# Patient Record
Sex: Female | Born: 1960 | Race: White | Hispanic: No | Marital: Married | Smoking: Current every day smoker
Health system: Southern US, Community
[De-identification: ages and names within clinical notes are randomized; demographics above are authoritative.]

## PROBLEM LIST (undated history)

## (undated) HISTORY — PX: CHOLECYSTECTOMY: SHX55

---

## 2000-01-09 ENCOUNTER — Encounter: Payer: Self-pay | Admitting: Gynecology

## 2000-01-09 ENCOUNTER — Encounter: Admission: RE | Admit: 2000-01-09 | Discharge: 2000-01-09 | Payer: Self-pay | Admitting: Gynecology

## 2001-09-24 ENCOUNTER — Other Ambulatory Visit: Admission: RE | Admit: 2001-09-24 | Discharge: 2001-09-24 | Payer: Self-pay | Admitting: Gynecology

## 2003-04-08 ENCOUNTER — Other Ambulatory Visit: Admission: RE | Admit: 2003-04-08 | Discharge: 2003-04-08 | Payer: Self-pay | Admitting: Gynecology

## 2003-08-26 ENCOUNTER — Encounter: Admission: RE | Admit: 2003-08-26 | Discharge: 2003-08-26 | Payer: Self-pay | Admitting: Gynecology

## 2004-08-23 ENCOUNTER — Other Ambulatory Visit: Admission: RE | Admit: 2004-08-23 | Discharge: 2004-08-23 | Payer: Self-pay | Admitting: Gynecology

## 2005-01-03 ENCOUNTER — Ambulatory Visit: Payer: Self-pay | Admitting: Hematology and Oncology

## 2005-06-27 ENCOUNTER — Ambulatory Visit: Payer: Self-pay | Admitting: Oncology

## 2005-09-06 ENCOUNTER — Other Ambulatory Visit: Admission: RE | Admit: 2005-09-06 | Discharge: 2005-09-06 | Payer: Self-pay | Admitting: Gynecology

## 2005-09-26 ENCOUNTER — Encounter: Admission: RE | Admit: 2005-09-26 | Discharge: 2005-09-26 | Payer: Self-pay | Admitting: Gynecology

## 2006-10-24 ENCOUNTER — Other Ambulatory Visit: Admission: RE | Admit: 2006-10-24 | Discharge: 2006-10-24 | Payer: Self-pay | Admitting: Gynecology

## 2006-11-12 ENCOUNTER — Encounter: Admission: RE | Admit: 2006-11-12 | Discharge: 2006-11-12 | Payer: Self-pay | Admitting: Gynecology

## 2006-11-21 ENCOUNTER — Encounter: Admission: RE | Admit: 2006-11-21 | Discharge: 2006-11-21 | Payer: Self-pay | Admitting: Gynecology

## 2007-11-19 ENCOUNTER — Encounter: Admission: RE | Admit: 2007-11-19 | Discharge: 2007-11-19 | Payer: Self-pay | Admitting: Gynecology

## 2021-10-16 ENCOUNTER — Encounter (HOSPITAL_BASED_OUTPATIENT_CLINIC_OR_DEPARTMENT_OTHER): Payer: Self-pay

## 2021-10-16 ENCOUNTER — Emergency Department (HOSPITAL_BASED_OUTPATIENT_CLINIC_OR_DEPARTMENT_OTHER)
Admission: EM | Admit: 2021-10-16 | Discharge: 2021-10-16 | Disposition: A | Discharge status: Home / Self Care | Payer: BC Managed Care – PPO | Attending: Emergency Medicine | Admitting: Emergency Medicine

## 2021-10-16 ENCOUNTER — Emergency Department (HOSPITAL_BASED_OUTPATIENT_CLINIC_OR_DEPARTMENT_OTHER): Payer: BC Managed Care – PPO

## 2021-10-16 ENCOUNTER — Other Ambulatory Visit: Payer: Self-pay

## 2021-10-16 DIAGNOSIS — R197 Diarrhea, unspecified: Secondary | ICD-10-CM | POA: Diagnosis not present

## 2021-10-16 DIAGNOSIS — R1013 Epigastric pain: Secondary | ICD-10-CM | POA: Diagnosis not present

## 2021-10-16 DIAGNOSIS — R110 Nausea: Secondary | ICD-10-CM | POA: Diagnosis not present

## 2021-10-16 LAB — CBC WITH DIFFERENTIAL/PLATELET
Abs Immature Granulocytes: 0.02 10*3/uL (ref 0.00–0.07)
Basophils Absolute: 0.1 10*3/uL (ref 0.0–0.1)
Basophils Relative: 1 %
Eosinophils Absolute: 0.1 10*3/uL (ref 0.0–0.5)
Eosinophils Relative: 2 %
HCT: 44.3 % (ref 36.0–46.0)
Hemoglobin: 14.7 g/dL (ref 12.0–15.0)
Immature Granulocytes: 0 %
Lymphocytes Relative: 35 %
Lymphs Abs: 2.2 10*3/uL (ref 0.7–4.0)
MCH: 31.1 pg (ref 26.0–34.0)
MCHC: 33.2 g/dL (ref 30.0–36.0)
MCV: 93.7 fL (ref 80.0–100.0)
Monocytes Absolute: 0.5 10*3/uL (ref 0.1–1.0)
Monocytes Relative: 8 %
Neutro Abs: 3.5 10*3/uL (ref 1.7–7.7)
Neutrophils Relative %: 54 %
Platelets: 211 10*3/uL (ref 150–400)
RBC: 4.73 MIL/uL (ref 3.87–5.11)
RDW: 12.6 % (ref 11.5–15.5)
WBC Morphology: ABNORMAL
WBC: 6.2 10*3/uL (ref 4.0–10.5)
nRBC: 0 % (ref 0.0–0.2)

## 2021-10-16 LAB — COMPREHENSIVE METABOLIC PANEL
ALT: 24 U/L (ref 0–44)
AST: 30 U/L (ref 15–41)
Albumin: 4.2 g/dL (ref 3.5–5.0)
Alkaline Phosphatase: 62 U/L (ref 38–126)
Anion gap: 10 (ref 5–15)
BUN: 9 mg/dL (ref 6–20)
CO2: 26 mmol/L (ref 22–32)
Calcium: 8.7 mg/dL — ABNORMAL LOW (ref 8.9–10.3)
Chloride: 99 mmol/L (ref 98–111)
Creatinine, Ser: 0.99 mg/dL (ref 0.44–1.00)
GFR, Estimated: >60 mL/min (ref >60)
Glucose, Bld: 115 mg/dL — ABNORMAL HIGH (ref 70–99)
Potassium: 3.9 mmol/L (ref 3.5–5.1)
Sodium: 135 mmol/L (ref 135–145)
Total Bilirubin: 0.7 mg/dL (ref 0.3–1.2)
Total Protein: 7.2 g/dL (ref 6.5–8.1)

## 2021-10-16 LAB — URINALYSIS, ROUTINE W REFLEX MICROSCOPIC
Bilirubin Urine: NEGATIVE
Glucose, UA: NEGATIVE mg/dL
Ketones, ur: NEGATIVE mg/dL
Leukocytes,Ua: NEGATIVE
Nitrite: NEGATIVE
Protein, ur: NEGATIVE mg/dL
Specific Gravity, Urine: 1.01 (ref 1.005–1.030)
pH: 7 (ref 5.0–8.0)

## 2021-10-16 LAB — URINALYSIS, MICROSCOPIC (REFLEX)

## 2021-10-16 LAB — LIPASE, BLOOD: Lipase: 28 U/L (ref 11–51)

## 2021-10-16 MED ORDER — SODIUM CHLORIDE 0.9 % IV BOLUS
1000.0000 mL | Freq: Once | INTRAVENOUS | Status: AC
  Administered 2021-10-16: 1000 mL via INTRAVENOUS

## 2021-10-16 MED ORDER — DICYCLOMINE HCL 20 MG PO TABS: 20.0000 mg | ORAL_TABLET | Freq: Two times a day (BID) | ORAL | 0 refills | Status: AC

## 2021-10-16 MED ORDER — IOHEXOL 300 MG/ML  SOLN
100.0000 mL | Freq: Once | INTRAMUSCULAR | Status: AC | PRN
  Administered 2021-10-16: 100 mL via INTRAVENOUS

## 2021-10-16 MED ORDER — ONDANSETRON 4 MG PO TBDP
4.0000 mg | ORAL_TABLET | Freq: Once | ORAL | Status: AC
  Administered 2021-10-16: 4 mg via ORAL
  Filled 2021-10-16: qty 1

## 2021-10-16 MED ORDER — ONDANSETRON HCL 4 MG PO TABS: 4.0000 mg | ORAL_TABLET | Freq: Four times a day (QID) | ORAL | 0 refills | Status: AC

## 2021-10-16 NOTE — ED Provider Notes (Addendum)
[facility] EMERGENCY DEPARTMENT Provider Note   CSN: [id number] Arrival date & time: 10/16/21  [id number]     History Chief Complaint  Patient presents with   Abdominal Pain    Carrie Hunt is a 61 y.o. female presents the emergency department for evaluation of diarrhea and abdominal pain.  Patient reports the first week into February, she was around her grandkids and got the same GI bug that they had and experienced 3 days of diarrhea, nausea, and vomiting without any abdominal pain.  She reports she was fine for a week and around Valentine's Day she started having some nausea.  A few days later, she reported some having some normal colored, watery stool around 6-7 episodes per day.  She reports that since then it is been 6-7 episodes per day.  Denies any dark or tarriness to it.  Denies any blood or bright red blood per rectum.  Denies any rectal pain.  She reports she started having some epigastric pain starting around February 16 to the 17th and feels like "her stomach is churning".  She was seen at her PCP office about this episode today and a few weeks ago.  Her stool tested positive for norovirus.  She was put on Carafate and continue to have the epigastric pain and diarrhea despite this.  Her PCP sent her to the emergency department since her insurance denied an outpatient CT scan.  She denies any vomiting other than the beginning of the month, fevers, chest pain, shortness of breath.  The patient does drink well water.  She reports she is a daily smoker and occasional marijuana user.  Denies any EtOH.   Abdominal Pain Associated symptoms: diarrhea and nausea   Associated symptoms: no chest pain, no chills, no cough, no dysuria, no fever, no hematuria, no shortness of breath, no sore throat and no vomiting       Home Medications Prior to Admission medications   Medication Sig Start Date End Date Taking? Authorizing Provider  dicyclomine (BENTYL) 20 MG tablet Take 1 tablet  (20 mg total) by mouth 2 (two) times daily. 10/16/21  Yes Sherrell Puller, PA-C  escitalopram (LEXAPRO) 10 MG tablet Take 10 mg by mouth daily.   Yes [provider]  ondansetron (ZOFRAN) 4 MG tablet Take 1 tablet (4 mg total) by mouth every 6 (six) hours. 10/16/21  Yes Sherrell Puller, PA-C      Allergies    Patient has no known allergies.    Review of Systems   Review of Systems  Constitutional:  Negative for chills and fever.  HENT:  Negative for congestion and sore throat.   Respiratory:  Negative for cough and shortness of breath.   Cardiovascular:  Negative for chest pain.  Gastrointestinal:  Positive for abdominal pain, diarrhea and nausea. Negative for anal bleeding, blood in stool, rectal pain and vomiting.  Genitourinary:  Negative for dysuria and hematuria.  Neurological:  Negative for weakness.   Physical Exam Updated Vital Signs BP 134/85    Pulse 73    Temp 98 F (36.7 C) (Oral)    Resp 17    Ht 5\' 4"  (1.626 m)    Wt 66.7 kg    SpO2 96%    BMI 25.23 kg/m  Physical Exam Vitals and nursing note reviewed.  Constitutional:      General: She is not in acute distress.    Appearance: She is not ill-appearing or toxic-appearing.  HENT:     Head: Normocephalic  and atraumatic.     Mouth/Throat:     Mouth: Mucous membranes are moist.     Pharynx: No pharyngeal swelling or oropharyngeal exudate.  Cardiovascular:     Rate and Rhythm: Normal rate.     Heart sounds: No murmur heard. Pulmonary:     Effort: No respiratory distress.     Breath sounds: Normal breath sounds.  Abdominal:     General: Abdomen is flat. Bowel sounds are normal.     Palpations: Abdomen is soft.     Tenderness: There is abdominal tenderness in the epigastric area. There is no right CVA tenderness, left CVA tenderness, guarding or rebound.     Comments: Small faint surgical scar noted near the umbilicus.  No other overlying skin changes noted.  No erythema, abrasion, ecchymosis, or rash noted.   Normal active bowel sounds.  Some mild tenderness palpation of the epigastric region without any guarding or rebound.  Soft abdomen.  Negative peritoneal signs.  Skin:    General: Skin is warm and dry.  Neurological:     Mental Status: She is alert.    ED Results / Procedures / Treatments   Labs (all labs ordered are listed, but only abnormal results are displayed) Labs Reviewed  URINALYSIS, ROUTINE W REFLEX MICROSCOPIC - Abnormal; Notable for the following components:      Result Value   Hgb urine dipstick TRACE (*)    All other components within normal limits  COMPREHENSIVE METABOLIC PANEL - Abnormal; Notable for the following components:   Glucose, Bld 115 (*)    Calcium 8.7 (*)    All other components within normal limits  URINALYSIS, MICROSCOPIC (REFLEX) - Abnormal; Notable for the following components:   Bacteria, UA RARE (*)    All other components within normal limits  CBC WITH DIFFERENTIAL/PLATELET  LIPASE, BLOOD    EKG None  Radiology CT ABDOMEN PELVIS W CONTRAST  Result Date: 10/16/2021 CLINICAL DATA:  Right upper quadrant abdominal pain on and off for 1 month. EXAM: CT ABDOMEN AND PELVIS WITH CONTRAST TECHNIQUE: Multidetector CT imaging of the abdomen and pelvis was performed using the standard protocol following bolus administration of intravenous contrast. RADIATION DOSE REDUCTION: This exam was performed according to the departmental dose-optimization program which includes automated exposure control, adjustment of the mA and/or kV according to patient size and/or use of iterative reconstruction technique. CONTRAST:  184mL OMNIPAQUE IOHEXOL 300 MG/ML  SOLN COMPARISON:  None. FINDINGS: Lower chest: The lung bases are clear of acute process. No pleural effusion or pulmonary lesions. The heart is normal in size. No pericardial effusion. Small hiatal hernia. Hepatobiliary: No hepatic lesions or intrahepatic biliary dilatation. The gallbladder is surgically absent. No common  bile duct dilatation. Pancreas: No mass, inflammation or ductal dilatation. Spleen: Normal size.  No focal lesions. Adrenals/Urinary Tract: Adrenal glands are normal. No renal lesions or hydronephrosis. No renal or obstructing ureteral calculi. The bladder is unremarkable. Stomach/Bowel: The stomach, duodenum, small bowel and colon are grossly normal without oral contrast. No inflammatory changes, mass lesions or obstructive findings. The appendix is normal. Vascular/Lymphatic: The aorta is normal in caliber. No dissection. The branch vessels are patent. The major venous structures are patent. No mesenteric or retroperitoneal mass or adenopathy. Small scattered lymph nodes are noted. Reproductive: The uterus and ovaries are unremarkable. Other: No pelvic mass or adenopathy. No free pelvic fluid collections. No inguinal mass or adenopathy. No abdominal wall hernia or subcutaneous lesions. Musculoskeletal: No significant bony findings. 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 IMPRESSION: 1. No acute abdominal/pelvic findings, mass lesions or adenopathy. 2. Status post cholecystectomy. No biliary dilatation. 3. Small hiatal hernia. Electronically Signed   By: Marijo Sanes M.D.   On: 10/16/2021 11:24    Procedures Procedures   Medications Ordered in ED Medications  sodium chloride 0.9 % bolus 1,000 mL (0 mLs Intravenous Stopped 10/16/21 1134)  iohexol (OMNIPAQUE) 300 MG/ML solution 100 mL (100 mLs Intravenous Contrast Given 10/16/21 1100)  ondansetron (ZOFRAN-ODT) disintegrating tablet 4 mg (4 mg Oral Given 10/16/21 1236)    ED Course/ Medical Decision Making/ A&P                           Medical Decision Making Amount and/or Complexity of Data Reviewed Labs: ordered. Radiology: ordered.  Risk Prescription drug management.   61 year old female presents emerged department for evaluation of epigastric pain and diarrhea ongoing to the past 3 to 4 weeks recent stool study positive for  Norovirus.  Differential diagnosis includes was not limited to infectious diarrhea, IBS, GERD, H. pylori, SBO, C. difficile.  Vital signs unremarkable.  Patient afebrile, normotensive, normal pulse rate, satting well on room air.  Physical exam is pertinent for well-appearing patient with some epigastric tenderness without any guarding or rebound.  Abdomen soft and nondistended with normal active bowel sounds.  No overlying skin changes noted.  Will order basic labs and abdominal CT.  Patient was given 1 L of NS while here as well as some Zofran.  I independently reviewed and interpreted the patient's labs and imaging and agree with radiologist findings.  CBC shows no signs of leukocytosis or anemia.  There was abnormal lymphocytes seen on the morphology of white blood cells, but unremarkable on the smear.  Lipase is normal.  CMP shows some slightly elevated glucose at 115.  Mildly decreased calcium at 8.7, otherwise no electrolyte abnormalities.  Normal LFTs.  Her creatinine 0.99 with GFR greater than 60.  Urinalysis shows trace blood although reflex shows 0-5 red blood cells.  Rare bacteria seen but also 0-5 white blood cells seen.  CT scan shows no acute abdominal pelvic findings mass, lesions, or adenopathy.  She is status postcholecystectomy.  No biliary dilation.  Small hiatal hernia seen.  There was some small scattered lymph nodes in the early read, but not mentioned in the impression. EKG checked for QT prolongation.   At this time, can rule out a small bowel obstruction.  The patient had a fecal study that showed norovirus, so less likely C. difficile.  She will need to follow-up with her PCP as well as her GI provider for further evaluation of H. pylori. Appointment with Dr. Crisoforo [facility] office in March for endoscopy.  Patient did not tolerate Carafate well, so will prescribe Bentyl as well as Zofran for her abdominal pain and diarrhea.  Discussed with Pharmacist Apolonio Schneiders on Citalopram with Zofran  used. Recommended not consistent use, but can use PRN. Return precautions discussed with her such as dark/tarry stool, blood in her stool, fever, worsening abdominal pain, or inability to tolerate any fluids or foods despite Zofran.  Patient verbalized understanding and agrees with the plan.  Patient is stable and being discharged home in good condition.  I discussed this case with my attending physician who cosigned this note including patient's presenting symptoms, physical exam, and planned diagnostics and interventions. Attending physician stated agreement with plan or made changes to plan which were implemented.   Final Clinical  Impression(s) / ED Diagnoses Final diagnoses:  Epigastric pain  Nausea    Rx / DC Orders ED Discharge Orders          Ordered    dicyclomine (BENTYL) 20 MG tablet  2 times daily        10/16/21 1223    ondansetron (ZOFRAN) 4 MG tablet  Every 6 hours        10/16/21 1223              Sherrell Puller, PA-C 10/16/21 1243    Sherrell Puller, PA-C 10/16/21 1311    Lennice Sites, DO 10/16/21 1335

## 2021-10-16 NOTE — Discharge Instructions (Addendum)
You were seen here today for evaluation of your abdominal pain with nausea and diarrhea. Your CT scan showed a small hiatal hernia, otherwise no signs of inflammation to your colon. You lab work is reassuring. Your glucose is mildly elevated and I would discuss this further with your PCP. I will prescribe you Bentyl to take twice daily for the next 10 days. Additionally, I have sent you in Zofran, a nausea medication to take as needed for your nausea. I would start with bland foods as listed in the information in this packet. Your next step is to follow up with the GI specialist that you were referred to. If something does not work out, I have also included the name with a GI specialist that you will need to call and make an appointment, if needed.   If you have any black, tarry, or bloody stools, please return to the ER. If you have any worsening pain, fever, chest pain, or SOB, please return to the ER for re-evaluation.

## 2021-10-16 NOTE — ED Triage Notes (Signed)
URQ pain for the past month on and off. +nausea.

## 2021-10-16 NOTE — ED Notes (Signed)
Pt given water for PO challenge 

## 2022-07-11 IMAGING — CT CT ABD-PELV W/ CM
2 of 5 series · 16 of 46 positions shown, 18 images · IV contrast (Omnipaque)
Comparison: None.

CLINICAL DATA: Right upper quadrant abdominal pain on and off for 1
month.

EXAM:
CT ABDOMEN AND PELVIS WITH CONTRAST
TECHNIQUE: Multidetector CT imaging of the abdomen and pelvis was performed
using the standard protocol following bolus administration of
intravenous contrast.

[Series 2: axial st · axial · 0.90mm/px · z∈[-491,-96]mm · 13 of 89 slices shown, 15 images]
[im 5/89  soft-tissue]
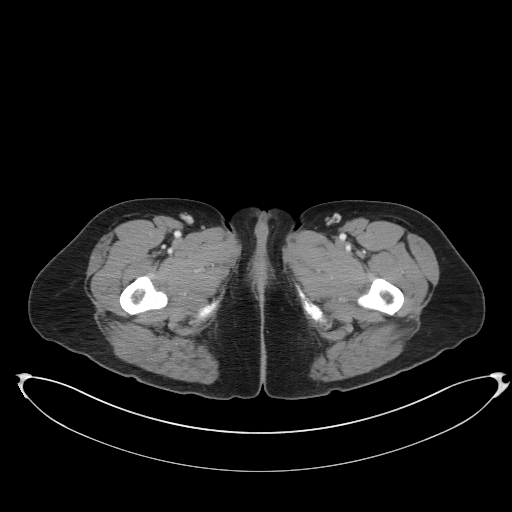
[im 5/89  bone]
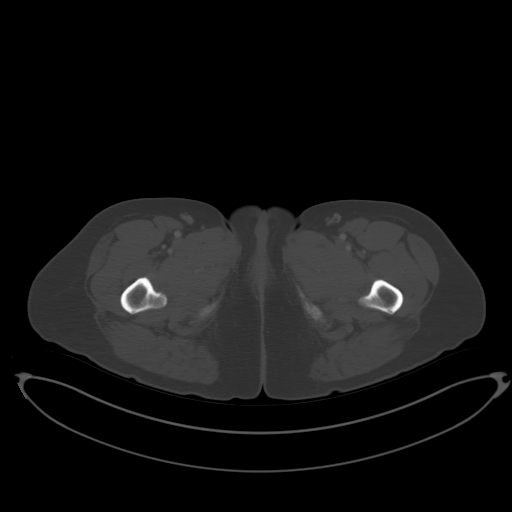
[im 14/89  soft-tissue]
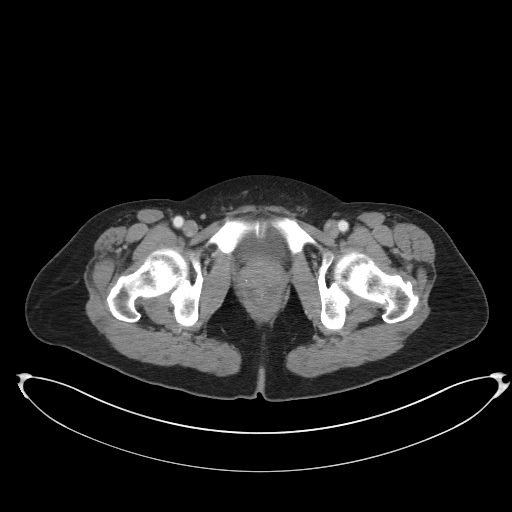
[im 19/89  soft-tissue]
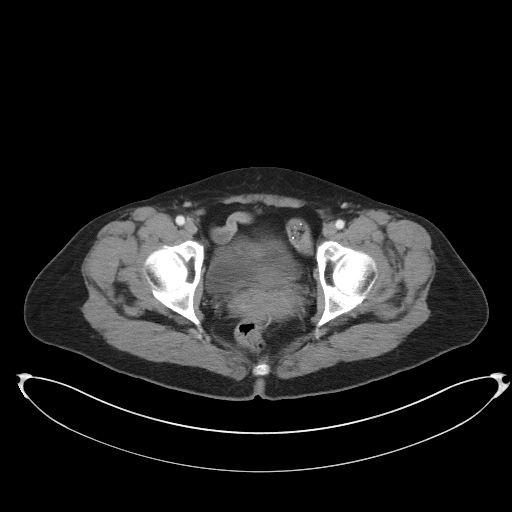
[im 24/89  soft-tissue]
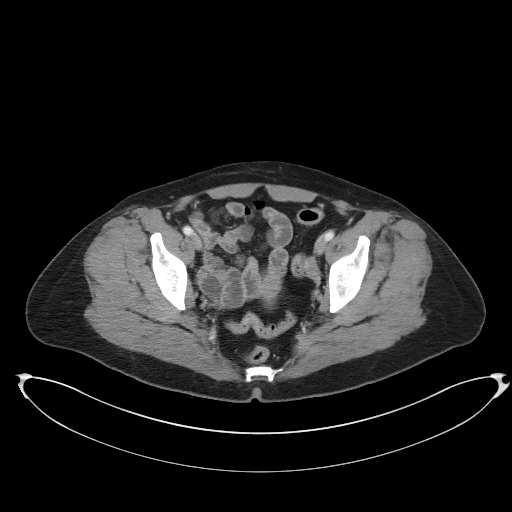
[im 33/89  soft-tissue]
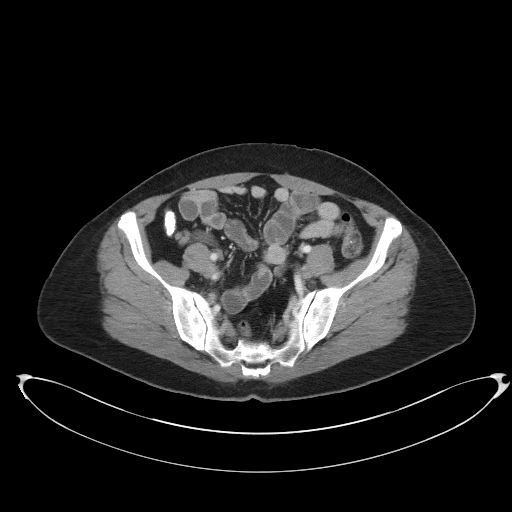
[im 38/89  soft-tissue]
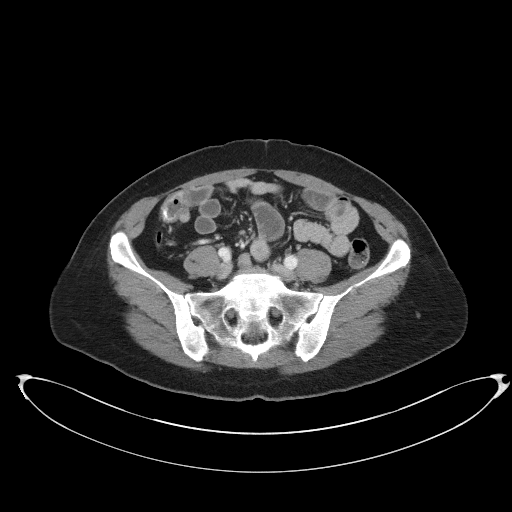
[im 47/89  soft-tissue]
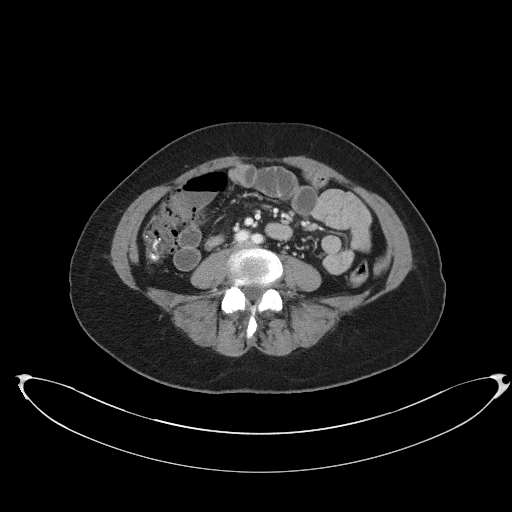
[im 51/89  soft-tissue]
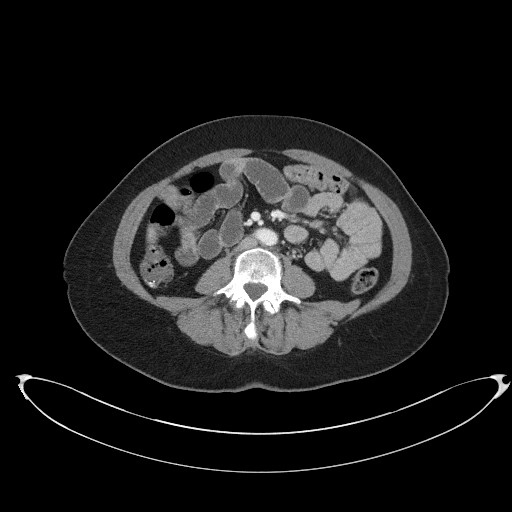
[im 56/89  soft-tissue]
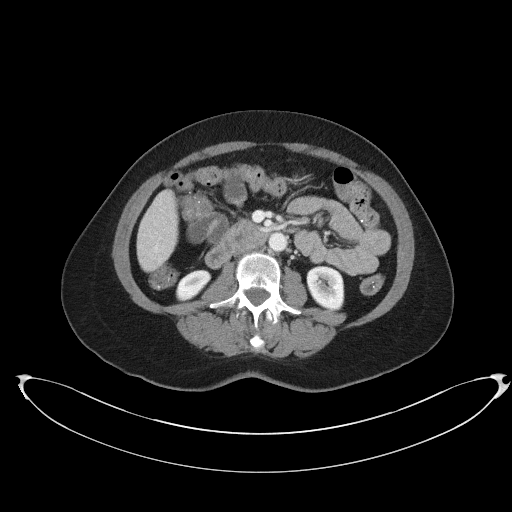
[im 56/89  bone]
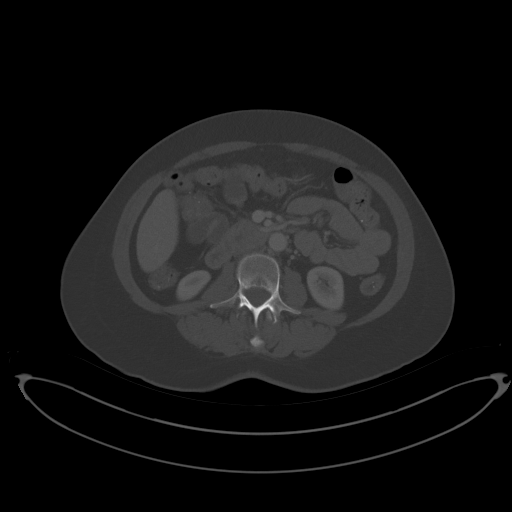
[im 65/89  soft-tissue]
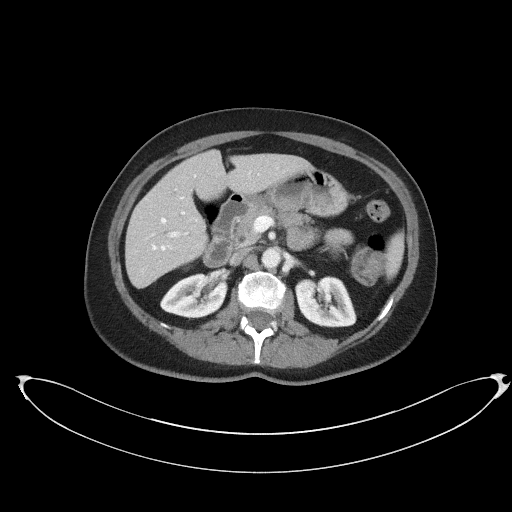
[im 70/89  soft-tissue]
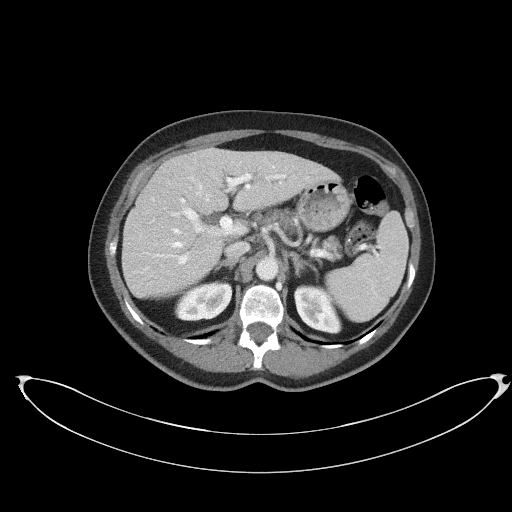
[im 75/89  soft-tissue]
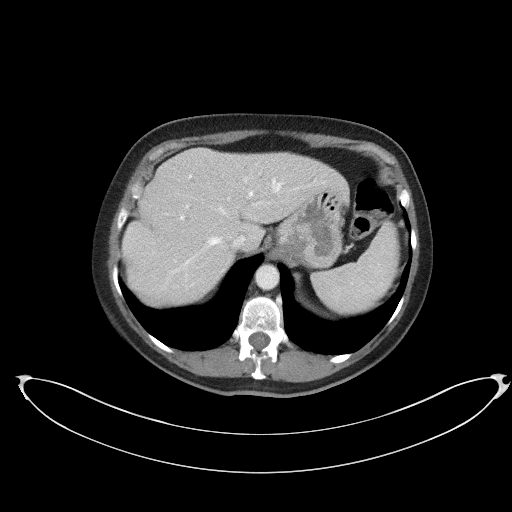
[im 84/89  soft-tissue]
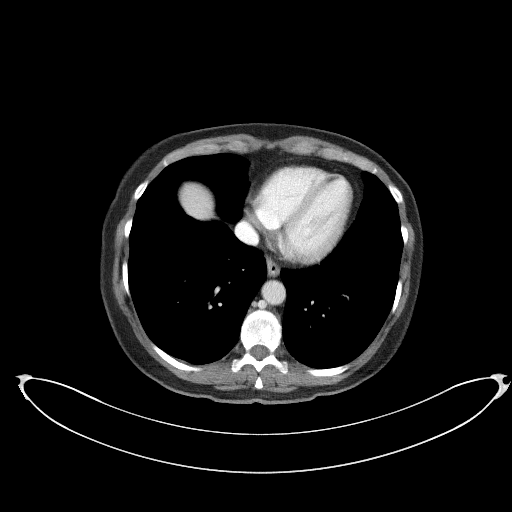

[Series 5: coronal st · coronal · 0.70mm/px · 3 of 91 slices shown]
[im 31/91  soft-tissue]
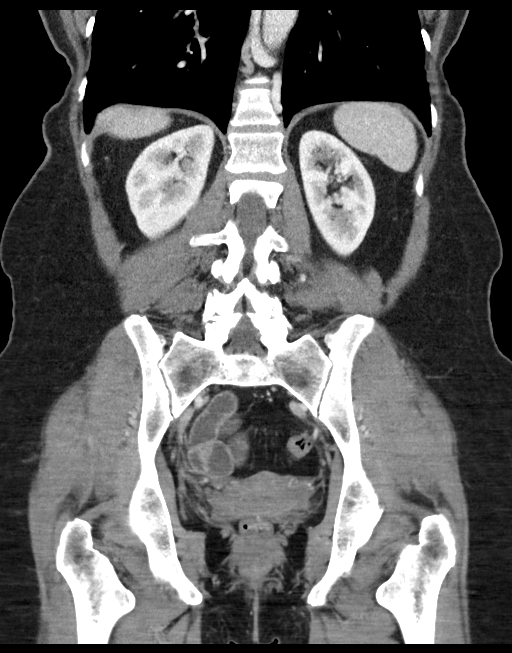
[im 41/91  soft-tissue]
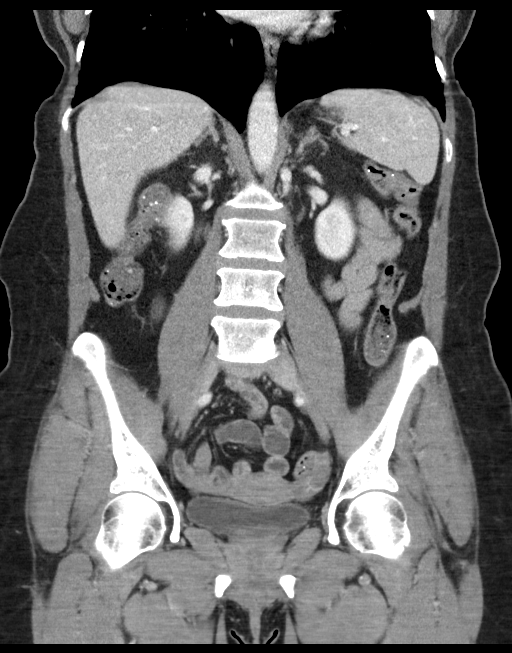
[im 51/91  soft-tissue]
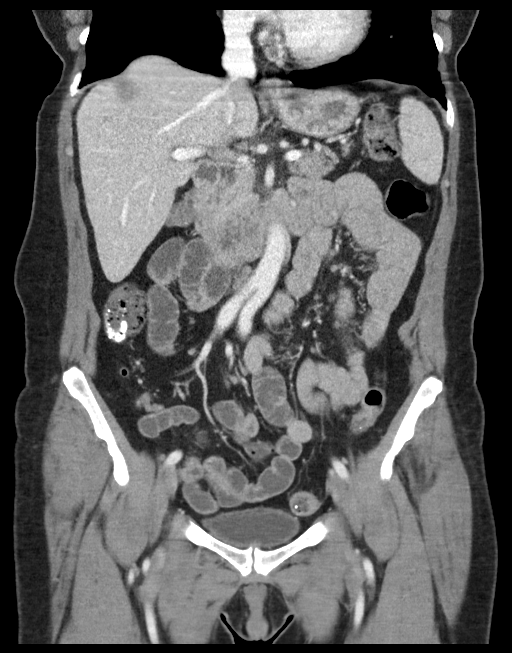

[16 of 46 positions shown; findings below may reference images not displayed]

RADIATION DOSE REDUCTION: This exam was performed according to the
departmental dose-optimization program which includes automated
exposure control, adjustment of the mA and/or kV according to
patient size and/or use of iterative reconstruction technique.

CONTRAST:  100mL OMNIPAQUE IOHEXOL 300 MG/ML  SOLN
FINDINGS: Lower chest: The lung bases are clear of acute process. No pleural
effusion or pulmonary lesions. The heart is normal in size. No
pericardial effusion. Small hiatal hernia.

Hepatobiliary: No hepatic lesions or intrahepatic biliary
dilatation. The gallbladder is surgically absent. No common bile
duct dilatation.

Pancreas: No mass, inflammation or ductal dilatation.

Spleen: Normal size.  No focal lesions.

Adrenals/Urinary Tract: Adrenal glands are normal. No renal lesions
or hydronephrosis. No renal or obstructing ureteral calculi. The
bladder is unremarkable.

Stomach/Bowel: The stomach, duodenum, small bowel and colon are
grossly normal without oral contrast. No inflammatory changes, mass
lesions or obstructive findings. The appendix is normal.

Vascular/Lymphatic: The aorta is normal in caliber. No dissection.
The branch vessels are patent. The major venous structures are
patent. No mesenteric or retroperitoneal mass or adenopathy. Small
scattered lymph nodes are noted.

Reproductive: The uterus and ovaries are unremarkable.

Other: No pelvic mass or adenopathy. No free pelvic fluid
collections. No inguinal mass or adenopathy. No abdominal wall
hernia or subcutaneous lesions.

Musculoskeletal: No significant bony findings. Moderate degenerate
disc disease noted at L5-S1.
IMPRESSION: 1. No acute abdominal/pelvic findings, mass lesions or adenopathy.
2. Status post cholecystectomy. No biliary dilatation.
3. Small hiatal hernia.

## 2024-02-03 ENCOUNTER — Ambulatory Visit: Payer: Self-pay | Admitting: Podiatry

## 2024-02-03 ENCOUNTER — Ambulatory Visit (INDEPENDENT_AMBULATORY_CARE_PROVIDER_SITE_OTHER)

## 2024-02-03 ENCOUNTER — Encounter: Payer: Self-pay | Admitting: Podiatry

## 2024-02-03 DIAGNOSIS — M722 Plantar fascial fibromatosis: Secondary | ICD-10-CM

## 2024-02-03 DIAGNOSIS — M778 Other enthesopathies, not elsewhere classified: Secondary | ICD-10-CM

## 2024-02-03 NOTE — Patient Instructions (Signed)
 More silicone and felt pads can be purchased from:  [email or web address]  Look for things such as dancers pads, horseshoe pads, callus pads.  You can buy more felt offloading padding from [employer], [employer], [employer], or from [employer] as well

## 2024-02-03 NOTE — Progress Notes (Unsigned)
       Chief Complaint  Patient presents with   Foot Pain    Bilateral arch pain. There are knots in the medial arch. The right foot has pain in it. Not diabetic and no anti coag     HPI: 63 y.o. female presents today concerned about firm nodules to the plantar surfaces to both feet.  They have slowly developed over the course of years.  They are nonpainful, nontender.  Do sometimes get aggravated with significant weightbearing or extra friction to the edges.  History reviewed. No pertinent past medical history.  Past Surgical History:  Procedure Laterality Date   CHOLECYSTECTOMY      No Known Allergies  ROS    Physical Exam: There were no vitals filed for this visit.  General: The patient is alert and oriented x3 in no acute distress.  Dermatology: Skin is warm, dry and supple bilateral lower extremities. Interspaces are clear of maceration and debris.    Vascular: Palpable pedal pulses bilaterally. Capillary refill within normal limits.  No appreciable edema.  No erythema or calor.  Neurological: Light touch sensation grossly intact bilateral feet.   Musculoskeletal Exam: Left foot larger nodule x 1 about 2 cm in diameter, right foot smaller nodules x 2 about 1 cm in diameter both along medial band plantar fascia, appears well adhered to the plantar fascia.  Nodules are firm, nontender.  Radiographic Exam: Left and right foot 3 views weightbearing 02/03/2024 Normal osseous mineralization. Joint spaces preserved.  No fractures or osseous irregularities noted.  Assessment/Plan of Care: 1. Plantar fascial fibromatosis      No orders of the defined types were placed in this encounter.  None  Discussed clinical findings with patient today.  Radiographs reviewed with patient.  Findings consistent with plantar fibroma x 1 left foot, x 2 right foot.  They are asymptomatic today.  We did discuss treatment for plantar fibromas mainly including offloading, NSAIDs, corticosteroid  injections to cause the lesions to atrophy.  We did also briefly discuss surgical excision of these if necessary.  Patient denying significant pain to these today.  Offloading felt padding applied.  Will proceed with offloading, over-the-counter NSAID use.  Follow-up as needed if symptoms worsen   Emmerson Shuffield L. Lunda Salines, AACFAS [facility]     [address] [place], [place] [address]                Office [phone]  Fax [phone]
